# Patient Record
Sex: Male | Born: 2000 | Race: Asian | Hispanic: No | Marital: Single | Smoking: Never smoker
Health system: Southern US, Community
[De-identification: ages and names within clinical notes are randomized; demographics above are authoritative.]

## PROBLEM LIST (undated history)

## (undated) DIAGNOSIS — L309 Dermatitis, unspecified: Secondary | ICD-10-CM

## (undated) HISTORY — DX: Dermatitis, unspecified: L30.9

---
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
## 2019-01-26 ENCOUNTER — Ambulatory Visit: Payer: Self-pay

## 2019-02-07 ENCOUNTER — Telehealth: Payer: Self-pay | Admitting: Licensed Clinical Social Worker

## 2019-02-07 ENCOUNTER — Telehealth: Payer: Self-pay | Admitting: Pediatrics

## 2019-02-07 DIAGNOSIS — F411 Generalized anxiety disorder: Secondary | ICD-10-CM

## 2019-02-07 NOTE — Telephone Encounter (Signed)
Dad in office and today and would like Dr Laurice Record to give him a call concerning Zoey. Dad would like a referral to [facility] for [place] for his behavioral health issues because [facility] in not in network for their insurance.

## 2019-02-07 NOTE — Telephone Encounter (Signed)
Call to Dad to explain that this [facility] put an order in the system for patient to see psychology at [facility] due to his request and insurance.  Jeanmarie Plant, [facility]  LOCATIONS Behavioral Medicine - [facility] [address] [place], [place] [address] [phone]  Explained that referral was entered through [facility] [facility]. Dad asked me to email him this information, which [facility] also completed.

## 2019-12-26 ENCOUNTER — Encounter (HOSPITAL_COMMUNITY): Payer: Self-pay

## 2019-12-26 ENCOUNTER — Other Ambulatory Visit: Payer: Self-pay

## 2019-12-26 ENCOUNTER — Emergency Department (HOSPITAL_COMMUNITY)
Admission: EM | Admit: 2019-12-26 | Discharge: 2019-12-26 | Disposition: A | Discharge status: Home / Self Care | Payer: PRIVATE HEALTH INSURANCE | Attending: Emergency Medicine | Admitting: Emergency Medicine

## 2019-12-26 ENCOUNTER — Emergency Department (HOSPITAL_COMMUNITY): Payer: PRIVATE HEALTH INSURANCE

## 2019-12-26 DIAGNOSIS — Y908 Blood alcohol level of 240 mg/100 ml or more: Secondary | ICD-10-CM | POA: Diagnosis not present

## 2019-12-26 DIAGNOSIS — F10920 Alcohol use, unspecified with intoxication, uncomplicated: Secondary | ICD-10-CM

## 2019-12-26 DIAGNOSIS — S0990XA Unspecified injury of head, initial encounter: Secondary | ICD-10-CM | POA: Diagnosis not present

## 2019-12-26 DIAGNOSIS — Y999 Unspecified external cause status: Secondary | ICD-10-CM | POA: Diagnosis not present

## 2019-12-26 DIAGNOSIS — Y9389 Activity, other specified: Secondary | ICD-10-CM | POA: Insufficient documentation

## 2019-12-26 DIAGNOSIS — W2201XA Walked into wall, initial encounter: Secondary | ICD-10-CM | POA: Insufficient documentation

## 2019-12-26 DIAGNOSIS — F10129 Alcohol abuse with intoxication, unspecified: Secondary | ICD-10-CM | POA: Diagnosis not present

## 2019-12-26 DIAGNOSIS — Y92410 Unspecified street and highway as the place of occurrence of the external cause: Secondary | ICD-10-CM | POA: Insufficient documentation

## 2019-12-26 LAB — CBC WITH DIFFERENTIAL/PLATELET
Abs Immature Granulocytes: 0.01 10*3/uL (ref 0.00–0.07)
Basophils Absolute: 0.1 10*3/uL (ref 0.0–0.1)
Basophils Relative: 1 %
Eosinophils Absolute: 0.2 10*3/uL (ref 0.0–0.5)
Eosinophils Relative: 3 %
HCT: 45.2 % (ref 39.0–52.0)
Hemoglobin: 15.7 g/dL (ref 13.0–17.0)
Immature Granulocytes: 0 %
Lymphocytes Relative: 40 %
Lymphs Abs: 2.4 10*3/uL (ref 0.7–4.0)
MCH: 31.5 pg (ref 26.0–34.0)
MCHC: 34.7 g/dL (ref 30.0–36.0)
MCV: 90.6 fL (ref 80.0–100.0)
Monocytes Absolute: 0.4 10*3/uL (ref 0.1–1.0)
Monocytes Relative: 6 %
Neutro Abs: 3 10*3/uL (ref 1.7–7.7)
Neutrophils Relative %: 50 %
Platelets: 229 10*3/uL (ref 150–400)
RBC: 4.99 MIL/uL (ref 4.22–5.81)
RDW: 11 % — ABNORMAL LOW (ref 11.5–15.5)
WBC: 6 10*3/uL (ref 4.0–10.5)
nRBC: 0 % (ref 0.0–0.2)

## 2019-12-26 LAB — COMPREHENSIVE METABOLIC PANEL
ALT: 14 U/L (ref 0–44)
AST: 25 U/L (ref 15–41)
Albumin: 5 g/dL (ref 3.5–5.0)
Alkaline Phosphatase: 72 U/L (ref 38–126)
Anion gap: 10 (ref 5–15)
BUN: 10 mg/dL (ref 6–20)
CO2: 26 mmol/L (ref 22–32)
Calcium: 9 mg/dL (ref 8.9–10.3)
Chloride: 109 mmol/L (ref 98–111)
Creatinine, Ser: 0.9 mg/dL (ref 0.61–1.24)
GFR calc Af Amer: >60 mL/min (ref >60)
GFR calc non Af Amer: >60 mL/min (ref >60)
Glucose, Bld: 112 mg/dL — ABNORMAL HIGH (ref 70–99)
Potassium: 4.7 mmol/L (ref 3.5–5.1)
Sodium: 145 mmol/L (ref 135–145)
Total Bilirubin: 2 mg/dL — ABNORMAL HIGH (ref 0.3–1.2)
Total Protein: 8.2 g/dL — ABNORMAL HIGH (ref 6.5–8.1)

## 2019-12-26 LAB — RAPID URINE DRUG SCREEN, HOSP PERFORMED
Amphetamines: NOT DETECTED
Barbiturates: NOT DETECTED
Benzodiazepines: NOT DETECTED
Cocaine: NOT DETECTED
Opiates: NOT DETECTED
Tetrahydrocannabinol: NOT DETECTED

## 2019-12-26 LAB — ETHANOL: Alcohol, Ethyl (B): 332 mg/dL (ref <10)

## 2019-12-26 MED ORDER — STERILE WATER FOR INJECTION IJ SOLN
INTRAMUSCULAR | Status: AC
  Filled 2019-12-26: qty 10

## 2019-12-26 MED ORDER — LORAZEPAM 2 MG/ML IJ SOLN: 2.0000 mg | Freq: Once | INTRAMUSCULAR | Status: AC

## 2019-12-26 MED ORDER — LORAZEPAM 2 MG/ML IJ SOLN
INTRAMUSCULAR | Status: AC
  Administered 2019-12-26: 2 mg via INTRAMUSCULAR
  Filled 2019-12-26: qty 1

## 2019-12-26 MED ORDER — ZIPRASIDONE MESYLATE 20 MG IM SOLR
INTRAMUSCULAR | Status: AC
  Filled 2019-12-26: qty 20

## 2019-12-26 MED ORDER — ZIPRASIDONE MESYLATE 20 MG IM SOLR
10.0000 mg | Freq: Once | INTRAMUSCULAR | Status: AC
  Administered 2019-12-26: 10 mg via INTRAMUSCULAR

## 2019-12-26 NOTE — ED Notes (Signed)
Patient discharged with his mother

## 2019-12-26 NOTE — ED Notes (Signed)
Attempted to reach one of patient's parents at Dr. Silvana Newness request.  No answer on Mom or Dad's number.  Message left for them to return call.  [place], EDP aware.

## 2019-12-26 NOTE — ED Notes (Signed)
Mother returned call and is on the way.  Will be here in 20 minutes.
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
## 2019-12-26 NOTE — ED Notes (Signed)
Placed seizure pads on patient's bed for his safety
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
## 2019-12-26 NOTE — ED Notes (Signed)
Parents letf phone number: [phone]; [phone]
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
